# Patient Record
Sex: Male | Born: 1990 | Race: Black or African American | Hispanic: No | Marital: Single
Health system: Southern US, Community
[De-identification: ages and names within clinical notes are randomized; demographics above are authoritative.]

---

## 2014-10-14 ENCOUNTER — Emergency Department (HOSPITAL_COMMUNITY)
Admission: EM | Admit: 2014-10-14 | Discharge: 2014-10-14 | Disposition: A | Discharge status: Home / Self Care | Payer: Self-pay | Attending: Emergency Medicine | Admitting: Emergency Medicine

## 2014-10-14 ENCOUNTER — Emergency Department (HOSPITAL_COMMUNITY): Payer: Self-pay

## 2014-10-14 ENCOUNTER — Encounter (HOSPITAL_COMMUNITY): Payer: Self-pay | Admitting: *Deleted

## 2014-10-14 DIAGNOSIS — R0789 Other chest pain: Secondary | ICD-10-CM | POA: Insufficient documentation

## 2014-10-14 DIAGNOSIS — K219 Gastro-esophageal reflux disease without esophagitis: Secondary | ICD-10-CM | POA: Insufficient documentation

## 2014-10-14 LAB — BASIC METABOLIC PANEL
ANION GAP: 10 (ref 5–15)
BUN: 10 mg/dL (ref 6–20)
CALCIUM: 9.7 mg/dL (ref 8.9–10.3)
CHLORIDE: 102 mmol/L (ref 101–111)
CO2: 26 mmol/L (ref 22–32)
Creatinine, Ser: 0.93 mg/dL (ref 0.61–1.24)
GFR calc Af Amer: >60 mL/min (ref >60)
Glucose, Bld: 105 mg/dL — ABNORMAL HIGH (ref 65–99)
Potassium: 4 mmol/L (ref 3.5–5.1)
Sodium: 138 mmol/L (ref 135–145)

## 2014-10-14 LAB — CBC
HCT: 46.9 % (ref 39.0–52.0)
Hemoglobin: 15.4 g/dL (ref 13.0–17.0)
MCH: 26.8 pg (ref 26.0–34.0)
MCHC: 32.8 g/dL (ref 30.0–36.0)
MCV: 81.7 fL (ref 78.0–100.0)
Platelets: 316 10*3/uL (ref 150–400)
RBC: 5.74 MIL/uL (ref 4.22–5.81)
RDW: 13.8 % (ref 11.5–15.5)
WBC: 7.1 10*3/uL (ref 4.0–10.5)

## 2014-10-14 LAB — I-STAT TROPONIN, ED: Troponin i, poc: 0 ng/mL (ref 0.00–0.08)

## 2014-10-14 MED ORDER — IBUPROFEN 800 MG PO TABS: 800.0000 mg | ORAL_TABLET | Freq: Three times a day (TID) | ORAL | Status: AC | PRN

## 2014-10-14 MED ORDER — IBUPROFEN 800 MG PO TABS
800.0000 mg | ORAL_TABLET | Freq: Once | ORAL | Status: AC
  Administered 2014-10-14: 800 mg via ORAL
  Filled 2014-10-14: qty 1

## 2014-10-14 MED ORDER — FAMOTIDINE 20 MG PO TABS
20.0000 mg | ORAL_TABLET | Freq: Once | ORAL | Status: AC
  Administered 2014-10-14: 20 mg via ORAL
  Filled 2014-10-14: qty 1

## 2014-10-14 MED ORDER — FAMOTIDINE 20 MG PO TABS: 20.0000 mg | ORAL_TABLET | Freq: Two times a day (BID) | ORAL | Status: AC

## 2014-10-14 NOTE — ED Notes (Signed)
Pt reports left side chest pain that has now moved to right side of chest. Denies sob. No distress noted at triage.

## 2014-10-14 NOTE — Discharge Instructions (Signed)
Read the information below.  Use the prescribed medication as directed.  Please discuss all new medications with your pharmacist.  You may return to the Emergency Department at any time for worsening condition or any new symptoms that concern you.  If you develop worsening chest pain, shortness of breath, fever, you pass out, or become weak or dizzy, return to the ER for a recheck.     Chest Pain (Nonspecific) It is often hard to give a diagnosis for the cause of chest pain. There is always a chance that your pain could be related to something serious, such as a heart attack or a blood clot in the lungs. You need to follow up with your doctor. HOME CARE  If antibiotic medicine was given, take it as directed by your doctor. Finish the medicine even if you start to feel better.  For the next few days, avoid activities that bring on chest pain. Continue physical activities as told by your doctor.  Do not use any tobacco products. This includes cigarettes, chewing tobacco, and e-cigarettes.  Avoid drinking alcohol.  Only take medicine as told by your doctor.  Follow your doctor's suggestions for more testing if your chest pain does not go away.  Keep all doctor visits you made. GET HELP IF:  Your chest pain does not go away, even after treatment.  You have a rash with blisters on your chest.  You have a fever. GET HELP RIGHT AWAY IF:   You have more pain or pain that spreads to your arm, neck, jaw, back, or belly (abdomen).  You have shortness of breath.  You cough more than usual or cough up blood.  You have very bad back or belly pain.  You feel sick to your stomach (nauseous) or throw up (vomit).  You have very bad weakness.  You pass out (faint).  You have chills. This is an emergency. Do not wait to see if the problems will go away. Call your local emergency services (911 in U.S[place] Do not drive yourself to the hospital. MAKE SURE YOU:   Understand these  instructions.  Will watch your condition.  Will get help right away if you are not doing well or get worse. Document Released: 09/09/2007 Document Revised: 03/28/2013 Document Reviewed: 09/09/2007 [facility] Patient Information 2015 [place], [place]. This information is not intended to replace advice given to you by your health care provider. Make sure you discuss any questions you have with your health care provider.  Food Choices for Gastroesophageal Reflux Disease When you have gastroesophageal reflux disease (GERD), the foods you eat and your eating habits are very important. Choosing the right foods can help ease your discomfort.  WHAT GUIDELINES DO I NEED TO FOLLOW?   Choose fruits, vegetables, whole grains, and low-fat dairy products.   Choose low-fat meat, fish, and poultry.  Limit fats such as oils, salad dressings, butter, nuts, and avocado.   Keep a food diary. This helps you identify foods that cause symptoms.   Avoid foods that cause symptoms. These may be different for everyone.   Eat small meals often instead of 3 large meals a day.   Eat your meals slowly, in a place where you are relaxed.   Limit fried foods.   Cook foods using methods other than frying.   Avoid drinking alcohol.   Avoid drinking large amounts of liquids with your meals.   Avoid bending over or lying down until 2-3 hours after eating.  WHAT FOODS ARE NOT  RECOMMENDED?  These are some foods and drinks that may make your symptoms worse: Vegetables Tomatoes. Tomato juice. Tomato and spaghetti sauce. Chili peppers. Onion and garlic. Horseradish. Fruits Oranges, grapefruit, and lemon (fruit and juice). Meats High-fat meats, fish, and poultry. This includes hot dogs, ribs, ham, sausage, salami, and bacon. Dairy Whole milk and chocolate milk. Sour cream. Cream. Butter. Ice cream. Cream cheese.  Drinks Coffee and tea. Bubbly (carbonated) drinks or energy drinks. Condiments Hot sauce.  Barbecue sauce.  Sweets/Desserts Chocolate and cocoa. Donuts. Peppermint and spearmint. Fats and Oils High-fat foods. This includes [place] fries and potato chips. Other Vinegar. Strong spices. This includes black pepper, white pepper, red pepper, cayenne, curry powder, cloves, ginger, and chili powder. The items listed above may not be a complete list of foods and drinks to avoid. Contact your dietitian for more information. Document Released: 09/22/2011 Document Revised: 03/28/2013 Document Reviewed: 01/25/2013 [facility] Patient Information 2015 [place], [place]. This information is not intended to replace advice given to you by your health care provider. Make sure you discuss any questions you have with your health care provider.    Emergency Department Resource Guide 1) Find a Doctor and Pay Out of Pocket Although you won't have to find out who is covered by your insurance plan, it is a good idea to ask around and get recommendations. You will then need to call the office and see if the doctor you have chosen will accept you as a new patient and what types of options they offer for patients who are self-pay. Some doctors offer discounts or will set up payment plans for their patients who do not have insurance, but you will need to ask so you aren't surprised when you get to your appointment.  2) Contact Your Local Health Department Not all health departments have doctors that can see patients for sick visits, but many do, so it is worth a call to see if yours does. If you don't know where your local health department is, you can check in your phone book. The CDC also has a tool to help you locate your state's health department, and many state websites also have listings of all of their local health departments.  3) Find a Walk-in Clinic If your illness is not likely to be very severe or complicated, you may want to try a walk in clinic. These are popping up all over the country in pharmacies,  drugstores, and shopping centers. They're usually staffed by nurse practitioners or physician assistants that have been trained to treat common illnesses and complaints. They're usually fairly quick and inexpensive. However, if you have serious medical issues or chronic medical problems, these are probably not your best option.  No Primary Care Doctor: - Call Health Connect at  [phone] - they can help you locate a primary care doctor that  accepts your insurance, provides certain services, etc. - Physician Referral Service- [phone]  Chronic Pain Problems: Organization         Address  Phone   Notes  [facility]  [phone] Patients need to be referred by their primary care doctor.   Medication Assistance: Organization         Address  Phone   Notes  [facility] [address] [place], [place] [address] [phone] --Must be a resident of [facility] -- Must have NO insurance coverage whatsoever (no Medicaid/ Medicare, etc.) -- The pt. MUST have a primary care  doctor that directs their care regularly and follows them in the community   [facility]ist  [phone]   [facility]  [phone]    Agencies that provide inexpensive medical care: Organization         Address  Phone   Notes  [facility]  [phone]   [facility]    [phone]   [facility] [address] [place], [place] [address] [phone]   [facility] [address] [place] [phone]   Planned Parenthood    [phone]   [facility]    [phone]   [facility] and [facility]  [address], [place] Phone:  [phone], Fax:  [phone] Hours of Operation:  9 am - 6 pm, M-F.  Also accepts Medicaid/Medicare and self-pay.  [facility]  [address], [place] Phone:  [phone], Fax: [phone]. Hours of Operation:  8:30 am - 5:30 pm, M-F.  Also accepts Medicaid and self-pay.  [facility] [place] [address] [place] Phone: [phone]   [facility] [address] Natasha Bence [place], [place] [phone], Ext. [phone] Mondays & Thursdays: 7-9 AM.  First 15 patients are seen on a first come, first serve basis.    Medicaid-accepting [facility] Providers:  Organization         Address  Phone   Notes  [facility] [address], [place] [phone] Also accepts self-pay patients.  [facility] [address] Laurell Josephs [place], [place]  [phone]   [facility] [address], [place] [phone]   [facility] [address] [place] [phone]   Renaye Rakers [address], [place]   [phone] Only accepts [place] Access [place] patients after they have their name applied to their card.   Self-Pay (no insurance) in [facility]:  Organization         Address  Phone   Notes  Sickle Cell Patients, [facility] [address] [place], [place] [phone]   [facility] [address] [place], [place] [phone]   [facility] [place]  [address], [place] [phone]   [facility]/Dr. Osei-Bonsu  [address], [place] or [address], [place] [phone] Phone number for both [place] and [place] locations is the same.  [facility]gent [facility] and [facility] [address] [place] [phone]   [facility] [address] [place] or [address] [phone]   [facility] [address], [place] [phone], phone; [phone], fax Sees patients 1st and 3rd Saturday of every month.  Must not qualify for public  or private insurance (i.e. Medicaid, Medicare, [place] Health Choice, Veterans' Benefits)  Household income should be no more than 200% of the poverty level The clinic cannot treat you if you are pregnant or think you are pregnant  Sexually transmitted diseases are not treated at the clinic.    Dental Care: Organization         Address  Phone  Notes  [facility] [address] [place], [place] [phone] Accepts  children up to age 24 who are enrolled in Medicaid or [place] Health Choice; pregnant women with a Medicaid card; and children who have applied for Medicaid or [place] Health Choice, but were declined, whose parents can pay a reduced fee at time of service.  [facility] [place]  [address], [place] [phone] Accepts children up to age 24 who are enrolled in [place] or [place] Health Choice; pregnant women with a Medicaid card; and children who have applied for Medicaid or [place] Health Choice, but were declined, whose parents can pay a reduced fee at time of service.  [facility]  [address], [place] [phone] Patients are seen by appointment only. Walk-ins are not accepted. [facility] will see patients 24 years of age and older. Monday - Tuesday (8am-5pm) Most Wednesdays (8:30-5pm) $30 per visit, cash only  [facility]  [address], [place] [facility] [place] [phone] Patients are seen by appointment only. Walk-ins are not accepted. [facility] will see patients 24 years of age and older. One Wednesday Evening (Monthly: Volunteer Based).  $30 per visit, cash only  [facility]  [phone] for adults; Children under age 454, call Graduate Pediatric Dentistry at [phone]. Children aged 714-14, please call [phone] to request a pediatric application.  Dental services are provided in all areas of  dental care including fillings, crowns and bridges, complete and partial dentures, implants, gum treatment, root canals, and extractions. Preventive care is also provided. Treatment is provided to both adults and children. Patients are selected via a lottery and there is often a waiting list.   [facility] [address], [place]  [phone] [email or web address]   [facility] [address], [place], [place] [phone], Ext. [phone] Second and Fourth Thursday of each month, opens at 6:30 AM; Clinic ends at 9 AM.  Patients are seen on a first-come first-served basis, and a limited number are seen during each clinic.   [facility]  [address], [place], [place] [phone]   Eligibility Requirements You must have lived in [place], [place], or [place] counties for at least the last three months.   You cannot be eligible for state or federal sponsored [place] Cityhealthcare insurance, including CIGNAVeterans Administration, [place]icaid, or [employer]Medicare.   You generally cannot be eligible for healthcare insurance through your employer.    How to apply: Eligibility screenings are held every Tuesday and Wednesday afternoon from 1:00 pm until 4:00 pm. You do not need an appointment for the interview!  [facility] [address], [place], [place] [phone]   [facility]  [phone]   [facility]  [phone]   [facility]  [phone]    Behavioral Health Resources in the Community: Intensive Outpatient Programs Organization         Address  Phone  Notes  [facility] [address], [place], [place] [phone]   [facility] [address], [place], [place] [phone]   [facility] [address], [place], [place]  [phone]   [facility] [address],  [place], [place] [phone] or  [phone]   Substance Abuse Resources Organization         Address  Phone  Notes  [facility]  [phone]  [facility]  [phone]   [facility]  [phone]   Floydene Flock  [phone]   Residential & Outpatient Substance Abuse Program  [phone]   Psychological Services Organization         Address  Phone  Notes  [facility]  [phone]   [facility]  [phone]   [facility] [address] [place] [phone] or [phone]    Mobile Crisis Teams Organization         Address  Phone  Notes  [facility], Mobile Crisis Care Unit  [phone]   [facility]  [address] [place], [place] [phone]   Doristine Locks [address] [place] [phone]    Self-Help/Support Groups Organization         Address  Phone             Notes  [facility] [place] - variety of support groups  [phone]- [phone] Call for more information  Narcotics Anonymous (NA), [facility] [address], [employer] [place]  2 meetings at this location   Statistician         Address  Phone  Notes  [facility] [address],    [place]  [phone]   [facility]  [address] [place] [address], [place], [place] [phone]   [facility] [address] [place], [place] [phone] Admissions: 8am-3pm M-F  [facility] [address],    [place], [place] [phone]   [facility] [address] [place], [place], [place] [phone]   [facility] [address],  [place], [place] [phone]   [facility] - Intensive Outpatient [address]ure[address], [place], [place] [phone]   [facility] ([facility] [address] [place].,  [place], [place] [phone] or [phone]   [facility]idential  Treatment Services (RTS) [address], [place], [place] [phone] Accepts Medicaid  [facility] [place] [address],  [place] [phone] Substance Abuse/Addiction Treatment   [facility] Organization         Address  Phone  Notes  [facility]  [phone]   Angie Fava, PhD [address] Ervin Knack [place], [place]   [phone] or [phone]   [facility]   [address] [place], [place] [phone]   [facility] [address] [place], [place] [phone] Insurance/Medicaid/sponsorship through [facility] and Families [address]                                    [place], [place] [phone] Therapy/tele-psych/case  [facility] [address], [place] [phone]    Dr. Lolly Mustache  [phone]   [facility]  [facility] Dept. 1) [address] [place] 2) [address] [place] 3)  [address], [place] [phone]  [phone]   [facility] Child Abuse Hotline [phone] or [phone] (After Hours)

## 2014-10-14 NOTE — ED Provider Notes (Signed)
CSN: [id number]     Arrival date & time 10/14/14  1525 History   First MD Initiated Contact with Patient 10/14/14 1938     Chief Complaint  Patient presents with  . Chest Pain     (Consider location/radiation/quality/duration/timing/severity/associated sxs/prior Treatment) The history is provided by the patient and a relative.   Patient presents with chest pain x 1 week.  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  He also notes more pain in the mornings.  Does have occasional symptoms of reflux.  His pain is not exertional or pleuritic.  Denies lightheadedness, SOB, diaphoresis, weakness or numbness of the arms, N/V, abdominal pain.  He does not have any medical problems but does have a family hx early CAD.  His father and paternal grandfather both died from MI in their 2540s.    History reviewed. No pertinent past medical history. History reviewed. No pertinent past surgical history. History reviewed. No pertinent family history. History  Substance Use Topics  . Smoking status: Not on file  . Smokeless tobacco: Not on file  . Alcohol Use: Yes     Comment: occ    Review of Systems  All other systems reviewed and are negative.     Allergies  Review of patient's allergies indicates no known allergies.  Home Medications   Prior to Admission medications   Medication Sig Start Date End Date Taking? Authorizing Provider  acetaminophen (TYLENOL) 500 MG tablet Take 1,000 mg by mouth every 6 (six) hours as needed for headache (pain).   Yes Historical Provider, MD   BP 151/74 mmHg  Pulse 89  Temp(Src) 98.8 F (37.1 C) (Oral)  Resp 15  Ht 6\' 2"  (1.88 m)  Wt 270 lb 12.8 oz (122.834 kg)  BMI 34.75 kg/m2  SpO2 97% Physical Exam   Constitutional: He appears well-developed and well-nourished. No distress.  HENT:  Head: Normocephalic and atraumatic.  Neck: Neck supple.  Cardiovascular: Normal rate and regular rhythm.   Pulmonary/Chest: Effort normal and breath sounds normal. No respiratory distress. He has no wheezes. He has no rales. He exhibits tenderness (small areas of tenderness).  Abdominal: Soft. He exhibits no distension and no mass. There is no tenderness. There is no rebound and no guarding.  Musculoskeletal: He exhibits no edema.  Upper extremities:  Strength 5/5, sensation intact, distal pulses intact.     Neurological: He is alert. He exhibits normal muscle tone.  Skin: He is not diaphoretic.  Psychiatric: He has a normal mood and affect. His behavior is normal.  Nursing note and vitals reviewed.   ED Course  Procedures (including critical care time) Labs Review Labs Reviewed  BASIC METABOLIC PANEL - Abnormal; Notable for the following:    Glucose, Bld 105 (*)    All other components within normal limits  CBC  I-STAT TROPOININ, ED    Imaging Review Dg Chest 2 View  10/14/2014   CLINICAL DATA:  Bilateral chest pain for the past week.  Dizziness.  EXAM: CHEST  2 VIEW  COMPARISON:  None.  FINDINGS: The heart size and mediastinal contours are within normal limits. Both lungs are clear. The visualized skeletal structures are unremarkable.  IMPRESSION: No active cardiopulmonary disease.  Electronically Signed   By: Gaylyn Rong M.D.   On: 10/14/2014 17:37     EKG Interpretation   Date/Time:  Sunday October 14 2014 15:30:56 EDT Ventricular Rate:  111 PR Interval:  128 QRS Duration: 80 QT Interval:  328 QTC Calculation: 446 R Axis:   83 Text Interpretation:  Sinus tachycardia Otherwise normal ECG No prior for  comparison Confirmed by Gwendolyn Grant  MD, BLAIR ([id number]) on 10/14/2014 7:39:19 PM      MDM   Final diagnoses:  Atypical chest pain  Gastroesophageal reflux disease without esophagitis     Afebrile, nontoxic patient with atypical chest pain.  He did a lot of heavy lifting recently that was unusual for him.  No associated symptoms.  It is reproduced with movement of his upper extremities, not with exertion.  Doubt ACS.  He has no risk factors for PE.  His only risk factors for CAD is family hx and obesity.  He does note occasional acid reflux-type symptoms, though this does seem more muscular in etiology.   D/C home with ibuprofen, pepcid, resources for PCP follow up.  Pt encouraged to follow closely with PCP.  Discussed result, findings, treatment, and follow up  with patient.  Pt given return precautions.  Pt verbalizes understanding and agrees with plan.         Trixie Dredge, PA-C 10/14/14 2105  Elwin Mocha, MD 10/15/14 [phone]

## 2016-01-29 IMAGING — DX DG CHEST 2V
2 series · 2 of 2 positions shown · non-contrast
Comparison: None.

CLINICAL DATA: Bilateral chest pain for the past week.  Dizziness.

EXAM:
CHEST  2 VIEW

[w chest pa]
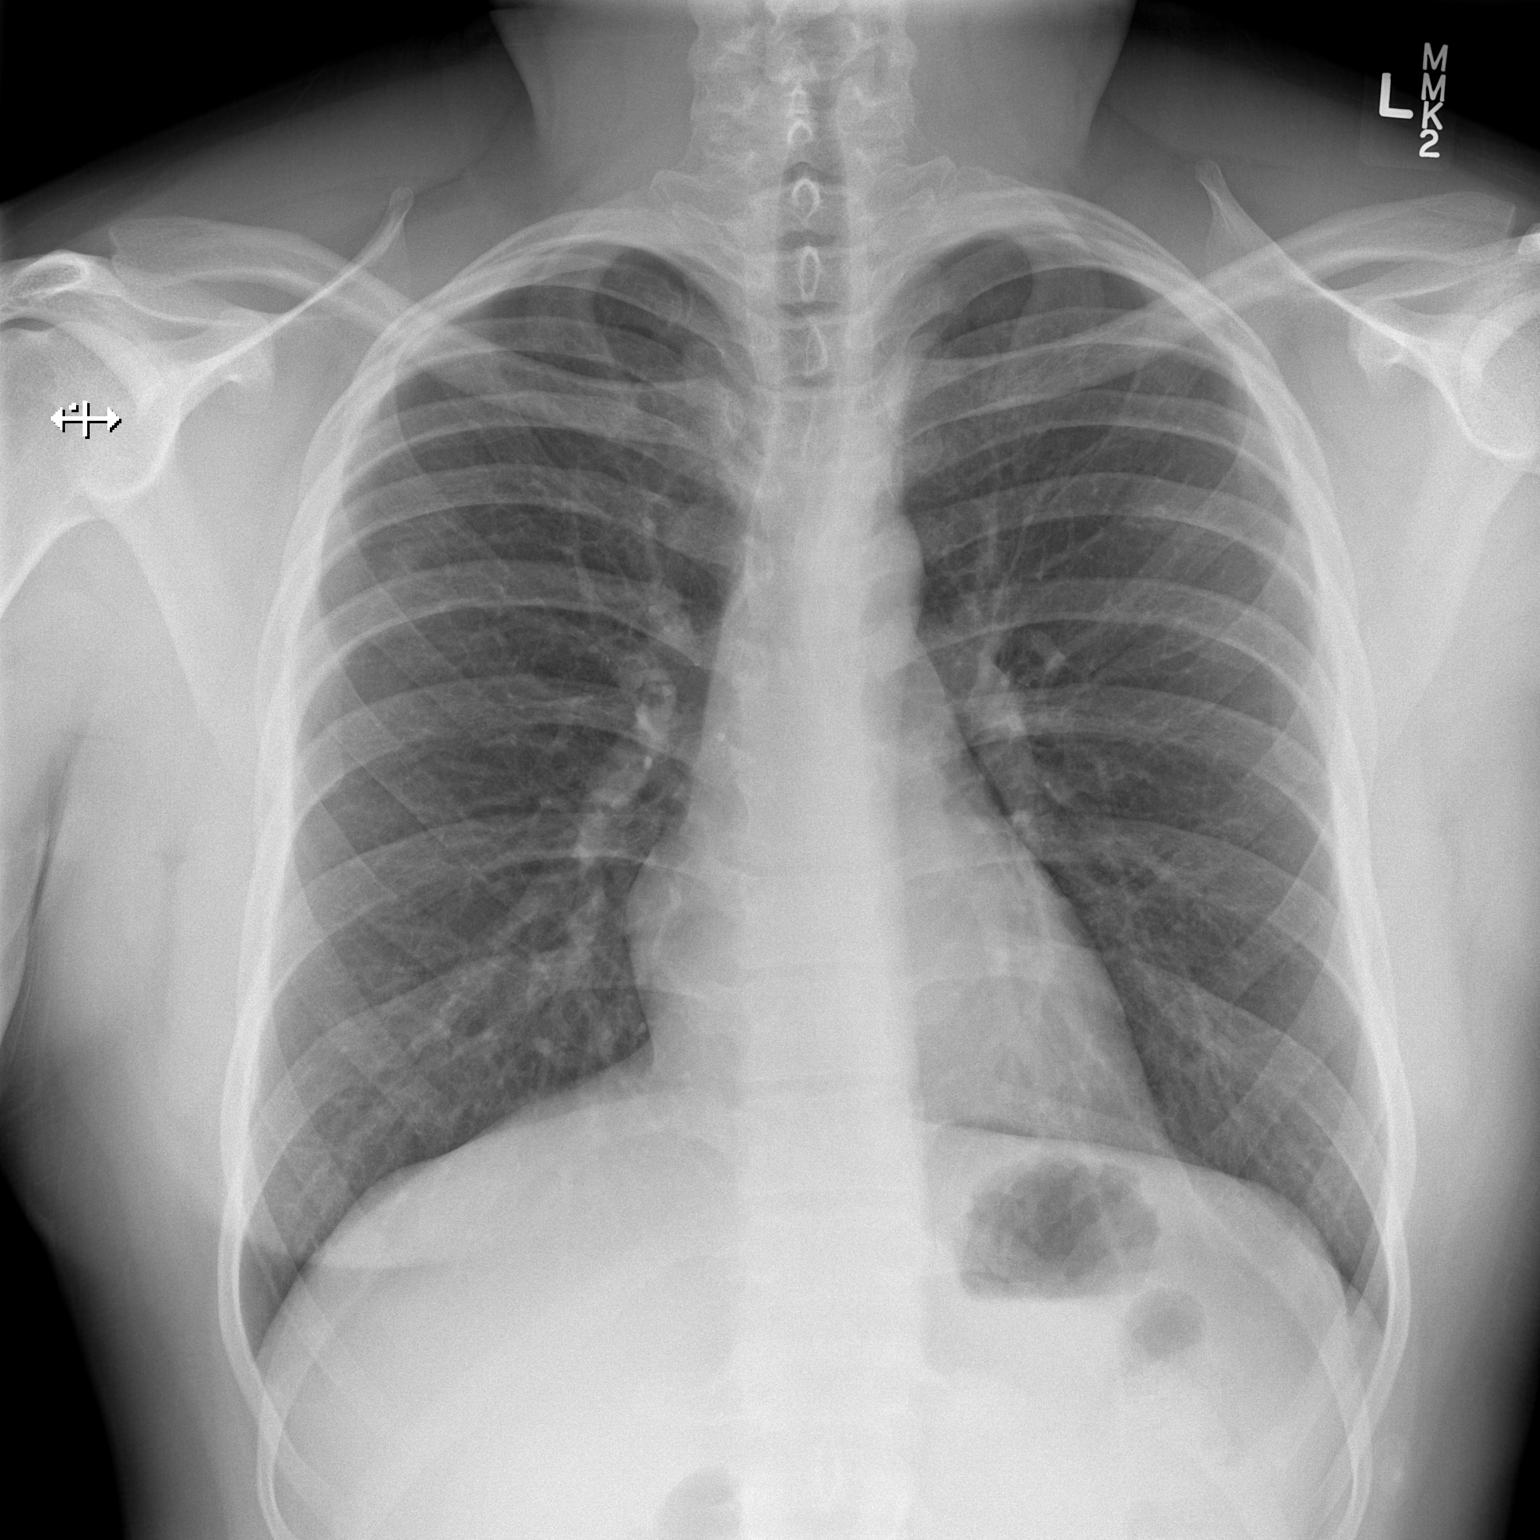

[w chest lat]
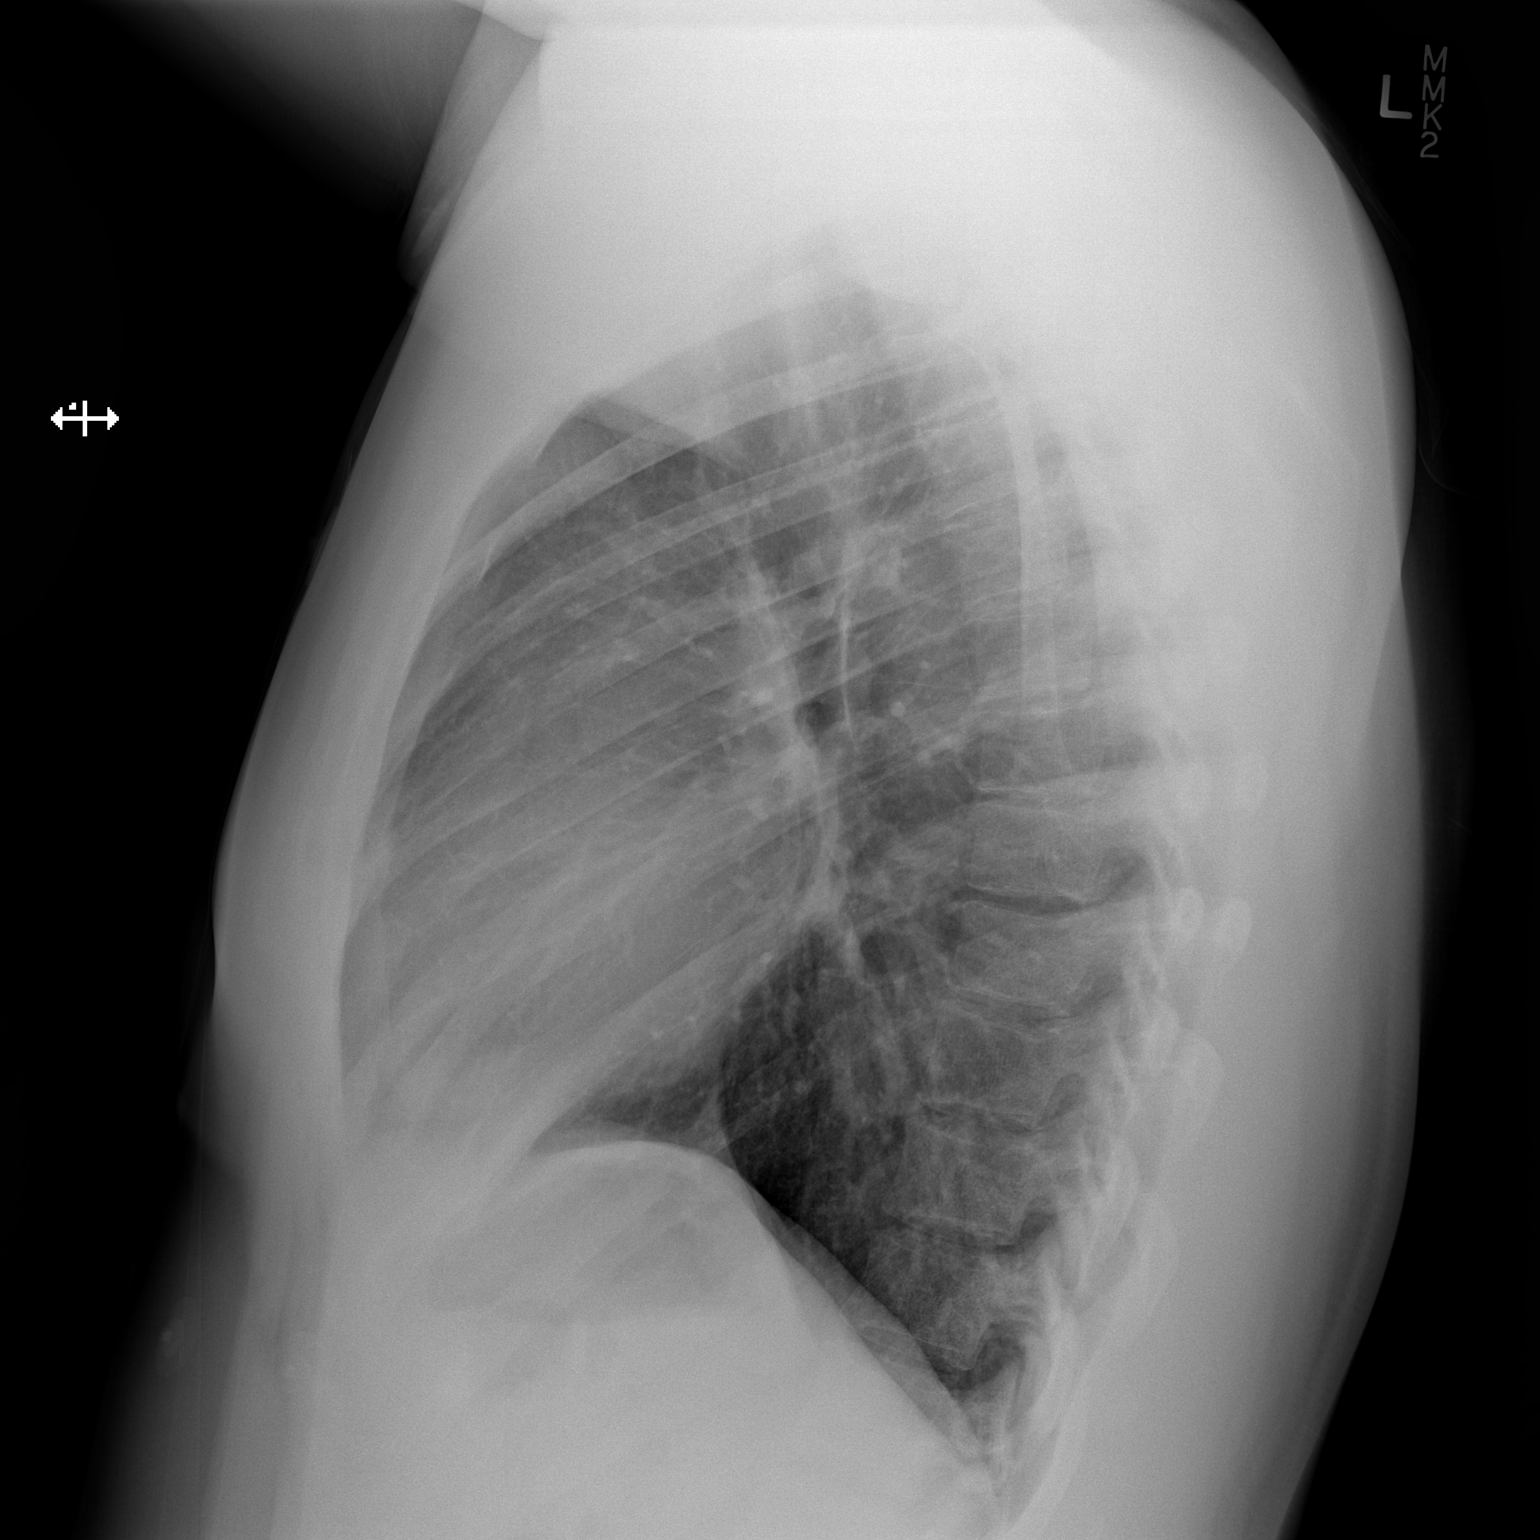

[2 of 2 positions shown; findings below may reference images not displayed]

FINDINGS: The heart size and mediastinal contours are within normal limits.
Both lungs are clear. The visualized skeletal structures are
unremarkable.
IMPRESSION: No active cardiopulmonary disease.
